# Patient Record
Sex: Male | Born: 1950 | Race: White | Hispanic: Yes | Marital: Married | Smoking: Former smoker
Health system: Southern US, Community
[De-identification: ages and names within clinical notes are randomized; demographics above are authoritative.]

## PROBLEM LIST (undated history)

## (undated) DIAGNOSIS — I10 Essential (primary) hypertension: Secondary | ICD-10-CM

---

## 2006-02-03 ENCOUNTER — Emergency Department (HOSPITAL_COMMUNITY): Admission: EM | Admit: 2006-02-03 | Discharge: 2006-02-03 | Payer: Self-pay | Admitting: Emergency Medicine

## 2007-07-08 ENCOUNTER — Emergency Department: Payer: Self-pay | Admitting: Unknown Physician Specialty

## 2010-09-08 ENCOUNTER — Emergency Department (HOSPITAL_COMMUNITY)
Admission: EM | Admit: 2010-09-08 | Discharge: 2010-09-08 | Disposition: A | Discharge status: Home / Self Care | Payer: Self-pay | Attending: Emergency Medicine | Admitting: Emergency Medicine

## 2010-09-08 DIAGNOSIS — H1130 Conjunctival hemorrhage, unspecified eye: Secondary | ICD-10-CM | POA: Insufficient documentation

## 2011-12-30 ENCOUNTER — Emergency Department: Payer: Self-pay | Admitting: *Deleted

## 2013-01-06 ENCOUNTER — Encounter (HOSPITAL_COMMUNITY): Payer: Self-pay | Admitting: Emergency Medicine

## 2013-01-06 ENCOUNTER — Emergency Department (HOSPITAL_COMMUNITY)
Admission: EM | Admit: 2013-01-06 | Discharge: 2013-01-06 | Disposition: A | Discharge status: Home / Self Care | Payer: Self-pay | Attending: Emergency Medicine | Admitting: Emergency Medicine

## 2013-01-06 DIAGNOSIS — Z87891 Personal history of nicotine dependence: Secondary | ICD-10-CM | POA: Insufficient documentation

## 2013-01-06 DIAGNOSIS — L988 Other specified disorders of the skin and subcutaneous tissue: Secondary | ICD-10-CM | POA: Insufficient documentation

## 2013-01-06 DIAGNOSIS — Z79899 Other long term (current) drug therapy: Secondary | ICD-10-CM | POA: Insufficient documentation

## 2013-01-06 DIAGNOSIS — I10 Essential (primary) hypertension: Secondary | ICD-10-CM | POA: Insufficient documentation

## 2013-01-06 DIAGNOSIS — B86 Scabies: Secondary | ICD-10-CM

## 2013-01-06 DIAGNOSIS — L299 Pruritus, unspecified: Secondary | ICD-10-CM | POA: Insufficient documentation

## 2013-01-06 DIAGNOSIS — R238 Other skin changes: Secondary | ICD-10-CM

## 2013-01-06 HISTORY — DX: Essential (primary) hypertension: I10

## 2013-01-06 MED ORDER — DEXAMETHASONE SODIUM PHOSPHATE 4 MG/ML IJ SOLN
10.0000 mg | Freq: Once | INTRAMUSCULAR | Status: AC
  Administered 2013-01-06: 10 mg via INTRAMUSCULAR
  Filled 2013-01-06: qty 1

## 2013-01-06 MED ORDER — DIPHENHYDRAMINE HCL 25 MG PO TABS: 25.0000 mg | ORAL_TABLET | Freq: Four times a day (QID) | ORAL | Status: DC

## 2013-01-06 MED ORDER — PERMETHRIN 5 % EX CREA: TOPICAL_CREAM | CUTANEOUS | Status: AC

## 2013-01-06 NOTE — ED Provider Notes (Signed)
Medical screening examination/treatment/procedure(s) were performed by non-physician practitioner and as supervising physician I was immediately available for consultation/collaboration.  Alexzia Kasler R. Zakeria Kulzer, MD 01/06/13 2323 

## 2013-01-06 NOTE — ED Notes (Signed)
Rash to rt upper arm , rt side of abd, with swelling of both hands.

## 2013-01-06 NOTE — ED Provider Notes (Signed)
History     CSN: [id number]  Arrival date & time 01/06/13  1953   First MD Initiated Contact with Patient 01/06/13 2055      Chief Complaint  Patient presents with  . Rash    poison ivy    (Consider location/radiation/quality/duration/timing/severity/associated sxs/prior treatment) HPI Gary Franklin is a 62 y.o. male who presents to the ED with a rash. The rash started several days ago. The rash is located on the hands between the fingers and the right forearm. The itching is getting worse. The patient has tried soaking his hands in Clorox and vinegar without relief. Since he started soaking them the hands have gotten red and have some swelling. The history was provided by the patient.    Past Medical History  Diagnosis Date  . Hypertension     History reviewed. No pertinent past surgical history.  No family history on file.  History  Substance Use Topics  . Smoking status: Former Smoker    Quit date: 01/06/1978  . Smokeless tobacco: Not on file  . Alcohol Use: No      Review of Systems  Constitutional: Negative for fever and chills.  HENT: Negative for facial swelling and neck pain.   Respiratory: Negative for shortness of breath.   Gastrointestinal: Negative for nausea and vomiting.  Skin: Positive for rash.  Neurological: Negative for weakness.  Psychiatric/Behavioral: The patient is not nervous/anxious.     Allergies  Review of patient's allergies indicates no known allergies.  Home Medications   Current Outpatient Rx  Name  Route  Sig  Dispense  Refill  . losartan-hydrochlorothiazide (HYZAAR) 50-12.5 MG per tablet   Oral   Take 1 tablet by mouth daily.           BP 117/80  Pulse 86  Temp(Src) 98.9 F (37.2 C) (Oral)  Resp 20  Ht 5' 5.5" (1.664 m)  Wt 174 lb 8 oz (79.153 kg)  BMI 28.59 kg/m2  SpO2 97%  Physical Exam  Nursing note and vitals reviewed. Constitutional: He is oriented to person, place, and time. He appears well-developed and  well-nourished. No distress.  HENT:  Head: Normocephalic.  Eyes: EOM are normal.  Neck: Neck supple.  Cardiovascular: Normal rate.   Pulmonary/Chest: Effort normal.  Musculoskeletal:  Rash noted between fingers and right forearm. linear with track like marks similar to scabies. Hand with some redness and swelling most likely due to the Clorox soaks.  Radial pulses strong, adequate circulation, good touch sensation.    Neurological: He is alert and oriented to person, place, and time. No cranial nerve deficit.  Skin: Skin is warm and dry.  Psychiatric: He has a normal mood and affect. His behavior is normal.    ED Course  Procedures (including critical care time)  MDM  62 y.o. male with rash to hand and right forearm. Will treat for scabies. Discussed with patient irritation of hands most likely due to the Clorox soaks and he should stop. Will give Decadron 10 mg IM now and Benadryl and Elimite cream to go home with. Discussed with the patient if symptoms worsen he will need to follow up with dermatology or return here if swelling increases.  Discussed with the patient and all questioned fully answered. He will return if any problems arise.    Medication List    TAKE these medications       diphenhydrAMINE 25 MG tablet  Commonly known as:  BENADRYL  Take 1 tablet (25 mg total)  by mouth every 6 (six) hours.     permethrin 5 % cream  Commonly known as:  ELIMITE  Apply to affected area once      ASK your doctor about these medications       losartan-hydrochlorothiazide 50-12.5 MG per tablet  Commonly known as:  HYZAAR  Take 1 tablet by mouth daily.               [place], [place] 01/06/13 2114

## 2013-01-06 NOTE — ED Notes (Signed)
Picking flowers two days ago, may have been exposed to poison oak.  Using "Rash Relief" with minimal relief.  Also ate about three red berries - unknown type.  Sour so did not eat more.   Both hands now swollen, rash, itching

## 2013-01-08 ENCOUNTER — Encounter (HOSPITAL_COMMUNITY): Payer: Self-pay | Admitting: *Deleted

## 2013-01-08 ENCOUNTER — Emergency Department (HOSPITAL_COMMUNITY): Admission: EM | Admit: 2013-01-08 | Discharge: 2013-01-08 | Discharge status: Against Medical Advice | Payer: Self-pay

## 2013-01-08 ENCOUNTER — Emergency Department (HOSPITAL_COMMUNITY)
Admission: EM | Admit: 2013-01-08 | Discharge: 2013-01-08 | Disposition: A | Discharge status: Home / Self Care | Payer: Self-pay | Attending: Emergency Medicine | Admitting: Emergency Medicine

## 2013-01-08 DIAGNOSIS — L259 Unspecified contact dermatitis, unspecified cause: Secondary | ICD-10-CM | POA: Insufficient documentation

## 2013-01-08 DIAGNOSIS — Z79899 Other long term (current) drug therapy: Secondary | ICD-10-CM | POA: Insufficient documentation

## 2013-01-08 DIAGNOSIS — Z87891 Personal history of nicotine dependence: Secondary | ICD-10-CM | POA: Insufficient documentation

## 2013-01-08 DIAGNOSIS — I10 Essential (primary) hypertension: Secondary | ICD-10-CM | POA: Insufficient documentation

## 2013-01-08 DIAGNOSIS — R609 Edema, unspecified: Secondary | ICD-10-CM | POA: Insufficient documentation

## 2013-01-08 MED ORDER — HYDROXYZINE HCL 25 MG PO TABS
25.0000 mg | ORAL_TABLET | Freq: Once | ORAL | Status: AC
  Administered 2013-01-08: 25 mg via ORAL
  Filled 2013-01-08: qty 1

## 2013-01-08 MED ORDER — PREDNISONE 50 MG PO TABS
60.0000 mg | ORAL_TABLET | Freq: Once | ORAL | Status: AC
  Administered 2013-01-08: 60 mg via ORAL
  Filled 2013-01-08: qty 1

## 2013-01-08 MED ORDER — HYDROXYZINE HCL 25 MG PO TABS: 25.0000 mg | ORAL_TABLET | Freq: Four times a day (QID) | ORAL | Status: AC

## 2013-01-08 MED ORDER — PREDNISONE 20 MG PO TABS: ORAL_TABLET | ORAL | Status: DC

## 2013-01-08 NOTE — ED Notes (Signed)
Instructions, prescriptions and f/u information given/reviewed - verbalizes understanding.  

## 2013-01-08 NOTE — ED Notes (Signed)
Pt c/o bilateral hand swelling, Was seen here on Tuesday and was given a shot and felt better. Now hands are swollen ans itching again and he has some places on his leg and foot.

## 2013-01-08 NOTE — ED Notes (Addendum)
Pt c/o bilateral hand swelling and itching. Was seen here Tuesday and was given a "shot" that made him feel better. Now his hands are swelling again.

## 2013-01-09 ENCOUNTER — Encounter (HOSPITAL_COMMUNITY): Payer: Self-pay | Admitting: *Deleted

## 2013-01-09 NOTE — ED Provider Notes (Signed)
History     CSN: [id number]  Arrival date & time 01/08/13  1846   First MD Initiated Contact with Patient 01/08/13 1901      Chief Complaint  Patient presents with  . Edema    (Consider location/radiation/quality/duration/timing/severity/associated sxs/prior treatment) HPI Comments: Gary Franklin is a 62 y.o. male who presents to the Emergency Department complaining of continued rash and itching.  States that he was here two days ago for rash to his hands and now the rash has spread to his elbows, right foot and right knee.  He also c/o swelling to his hands and itching.  He was given permethrin cream that he has used w/o relief.  He also states that his symptoms were better initially after receiving a "shot" here in the ED.  He denies fever, vomiting, tick bite, joint pains, difficulty swallowing or breathing, or swelling  The history is provided by the patient.    Past Medical History  Diagnosis Date  . Hypertension     History reviewed. No pertinent past surgical history.  History reviewed. No pertinent family history.  History  Substance Use Topics  . Smoking status: Former Smoker    Quit date: 01/06/1978  . Smokeless tobacco: Not on file  . Alcohol Use: No      Review of Systems  Constitutional: Negative for fever, chills, activity change and appetite change.  HENT: Negative for sore throat, facial swelling, trouble swallowing, neck pain and neck stiffness.   Respiratory: Negative for chest tightness, shortness of breath and wheezing.   Gastrointestinal: Negative for nausea and vomiting.  Musculoskeletal: Negative for arthralgias.  Skin: Positive for rash. Negative for wound.  Neurological: Negative for dizziness, weakness, numbness and headaches.  All other systems reviewed and are negative.    Allergies  Review of patient's allergies indicates no known allergies.  Home Medications   Current Outpatient Rx  Name  Route  Sig  Dispense  Refill  .  Dimethicone-Zinc Oxide (RASH RELIEF) 20-25 % LIQD   Apply externally   Apply 1 spray topically daily as needed (for rash).         Marland Kitchen losartan-hydrochlorothiazide (HYZAAR) 50-12.5 MG per tablet   Oral   Take 1 tablet by mouth daily.         . hydrOXYzine (ATARAX/VISTARIL) 25 MG tablet   Oral   Take 1 tablet (25 mg total) by mouth every 6 (six) hours. Prn itching   15 tablet   0   . permethrin (ELIMITE) 5 % cream      Apply to affected area once   60 g   0   . predniSONE (DELTASONE) 20 MG tablet      Take 3 tablets po qd x 2 days, then 2 tablets po qd x 2 days, then 1 tablet po qd x 2 days   12 tablet   0     BP 144/99  Pulse 72  Temp(Src) 98.6 F (37 C) (Oral)  Resp 18  Ht 5\' 5"  (1.651 m)  Wt 174 lb (78.926 kg)  BMI 28.96 kg/m2  SpO2 100%  Physical Exam  Nursing note and vitals reviewed. Constitutional: He is oriented to person, place, and time. He appears well-developed and well-nourished. No distress.  HENT:  Head: Normocephalic and atraumatic. No trismus in the jaw.  Mouth/Throat: Uvula is midline, oropharynx is clear and moist and mucous membranes are normal. No edematous.  Eyes: Conjunctivae and EOM are normal. Pupils are equal, round, and reactive to  light.  Neck: Normal range of motion. Neck supple.  Cardiovascular: Normal rate, regular rhythm, normal heart sounds and intact distal pulses.   No murmur heard. Pulmonary/Chest: Effort normal and breath sounds normal. No respiratory distress.  Musculoskeletal: He exhibits no edema and no tenderness.  Lymphadenopathy:    He has no cervical adenopathy.  Neurological: He is alert and oriented to person, place, and time. He exhibits normal muscle tone. Coordination normal.  Skin: Rash noted. There is erythema.  Maculopapular rash to the right knee, right dorsal foot, and bilateral elbows.  No edema or rash of the hands seen on exam.  Radial pulses brisk and equal.  No lesions of the hands or web spaces.     Psychiatric: His behavior is normal.    ED Course  Procedures (including critical care time)  Labs Reviewed - No data to display No results found.   1. Contact dermatitis       MDM    Previous ED chart reviewed, patient is well appearing, VSS,  erythematous maculopapular rash to bilateral hands, and now right knee, dorsal right foot and bilateral elbows.  No edema.  Appears at this time c/w contact dermatitis likely secondary to plant exposure.  Limited improvement initially after receiving steroid injection.  I will prescribe prednisone taper and atarax for itching and have him d/c the permethrin cream.  Appears stable for d/c and agrees to return here if needed        Traeson Dusza L. Trisha Mangle, PA-C 01/09/13 1027

## 2013-01-09 NOTE — ED Provider Notes (Signed)
Medical screening examination/treatment/procedure(s) were performed by non-physician practitioner and as supervising physician I was immediately available for consultation/collaboration.    Jandi Swiger D Amato Sevillano, MD 01/09/13 0956 

## 2016-11-20 ENCOUNTER — Encounter: Payer: Self-pay | Admitting: *Deleted

## 2016-11-20 ENCOUNTER — Emergency Department: Payer: Self-pay

## 2016-11-20 ENCOUNTER — Emergency Department
Admission: EM | Admit: 2016-11-20 | Discharge: 2016-11-20 | Disposition: A | Discharge status: Home / Self Care | Payer: Self-pay | Attending: Emergency Medicine | Admitting: Emergency Medicine

## 2016-11-20 DIAGNOSIS — Z79899 Other long term (current) drug therapy: Secondary | ICD-10-CM | POA: Insufficient documentation

## 2016-11-20 DIAGNOSIS — Z87891 Personal history of nicotine dependence: Secondary | ICD-10-CM | POA: Insufficient documentation

## 2016-11-20 DIAGNOSIS — I10 Essential (primary) hypertension: Secondary | ICD-10-CM | POA: Insufficient documentation

## 2016-11-20 DIAGNOSIS — N200 Calculus of kidney: Secondary | ICD-10-CM | POA: Insufficient documentation

## 2016-11-20 LAB — CBC
HCT: 49.6 % (ref 40.0–52.0)
HEMOGLOBIN: 16.7 g/dL (ref 13.0–18.0)
MCH: 28.9 pg (ref 26.0–34.0)
MCHC: 33.7 g/dL (ref 32.0–36.0)
MCV: 85.8 fL (ref 80.0–100.0)
Platelets: 212 10*3/uL (ref 150–440)
RBC: 5.78 MIL/uL (ref 4.40–5.90)
RDW: 13.8 % (ref 11.5–14.5)
WBC: 7.6 10*3/uL (ref 3.8–10.6)

## 2016-11-20 LAB — BASIC METABOLIC PANEL
Anion gap: 10 (ref 5–15)
BUN: 11 mg/dL (ref 6–20)
CHLORIDE: 104 mmol/L (ref 101–111)
CO2: 24 mmol/L (ref 22–32)
Calcium: 9.4 mg/dL (ref 8.9–10.3)
Creatinine, Ser: 1.03 mg/dL (ref 0.61–1.24)
GFR calc Af Amer: >60 mL/min (ref >60)
GFR calc non Af Amer: >60 mL/min (ref >60)
Glucose, Bld: 188 mg/dL — ABNORMAL HIGH (ref 65–99)
POTASSIUM: 3.7 mmol/L (ref 3.5–5.1)
SODIUM: 138 mmol/L (ref 135–145)

## 2016-11-20 LAB — URINALYSIS, COMPLETE (UACMP) WITH MICROSCOPIC
Bacteria, UA: NONE SEEN
Bilirubin Urine: NEGATIVE
Glucose, UA: 150 mg/dL — AB
Hgb urine dipstick: NEGATIVE
KETONES UR: NEGATIVE mg/dL
Leukocytes, UA: NEGATIVE
Nitrite: NEGATIVE
Protein, ur: NEGATIVE mg/dL
SPECIFIC GRAVITY, URINE: 1.012 (ref 1.005–1.030)
SQUAMOUS EPITHELIAL / LPF: NONE SEEN
pH: 6 (ref 5.0–8.0)

## 2016-11-20 MED ORDER — ONDANSETRON HCL 4 MG/2ML IJ SOLN
4.0000 mg | Freq: Once | INTRAMUSCULAR | Status: AC
  Administered 2016-11-20: 4 mg via INTRAVENOUS
  Filled 2016-11-20: qty 2

## 2016-11-20 MED ORDER — TAMSULOSIN HCL 0.4 MG PO CAPS: 0.4000 mg | ORAL_CAPSULE | Freq: Every day | ORAL | 0 refills | Status: AC

## 2016-11-20 MED ORDER — OXYCODONE-ACETAMINOPHEN 5-325 MG PO TABS: 1.0000 | ORAL_TABLET | Freq: Four times a day (QID) | ORAL | 0 refills | Status: AC | PRN

## 2016-11-20 MED ORDER — KETOROLAC TROMETHAMINE 30 MG/ML IJ SOLN
30.0000 mg | Freq: Once | INTRAMUSCULAR | Status: AC
  Administered 2016-11-20: 30 mg via INTRAVENOUS

## 2016-11-20 MED ORDER — KETOROLAC TROMETHAMINE 30 MG/ML IJ SOLN
INTRAMUSCULAR | Status: AC
  Filled 2016-11-20: qty 1

## 2016-11-20 MED ORDER — OXYCODONE-ACETAMINOPHEN 5-325 MG PO TABS
1.0000 | ORAL_TABLET | ORAL | Status: DC | PRN
  Administered 2016-11-20: 1 via ORAL
  Filled 2016-11-20: qty 1

## 2016-11-20 MED ORDER — IBUPROFEN 600 MG PO TABS: 600.0000 mg | ORAL_TABLET | Freq: Four times a day (QID) | ORAL | 0 refills | Status: AC | PRN

## 2016-11-20 MED ORDER — SODIUM CHLORIDE 0.9 % IV BOLUS (SEPSIS)
1000.0000 mL | Freq: Once | INTRAVENOUS | Status: AC
  Administered 2016-11-20: 1000 mL via INTRAVENOUS

## 2016-11-20 NOTE — ED Notes (Signed)
Pt returned from CT via stretcher.

## 2016-11-20 NOTE — ED Notes (Signed)
Pt states he speaks enough english and denied needing an interpreter.   States 2 weeks of R sided back pain that feels like a kidney stone. States hx of kidney stones. Nausea and 3 episodes vomiting today, denies blood in vomit. States has noticed blood in urine. Alert, oriented, able to move self from wheelchair to bed.

## 2016-11-20 NOTE — ED Triage Notes (Signed)
Pt states right groin pain that goes to his back, states pain began 2 weeks ago and progressively getting worse, states blood in his urine, awake alert, interpretor used

## 2016-11-20 NOTE — ED Provider Notes (Signed)
[facility] Emergency Department Provider Note  Time seen: 2:09 PM  I have reviewed the triage vital signs and the nursing notes.   HISTORY  Chief Complaint Back Pain    HPI Gary Franklin is a 66 y.o. male with a past medical history of hypertension, prior kidney stones, presents to the emergency department with right flank pain for the past 2 weeks. According to the patient for the past 2 weeks he has had progressively worsening right flank pain. States it is a dull aching type pain with occasional sharp pains radiating to his groin from his right back. Patient states a history of kidney stones in the past which have passed on their own, he states this does feel similar. He has noted intermittent dark urine, denies any today. States nausea with one episode of vomiting. Denies diarrhea. Denies fever. Denies dysuria.  Past Medical History:  Diagnosis Date  . Hypertension     There are no active problems to display for this patient.   History reviewed. No pertinent surgical history.  Prior to Admission medications   Medication Sig Start Date End Date Taking? Authorizing Provider  Dimethicone-Zinc Oxide (RASH RELIEF) 20-25 % LIQD Apply 1 spray topically daily as needed (for rash).    Historical Provider, MD  hydrOXYzine (ATARAX/VISTARIL) 25 MG tablet Take 1 tablet (25 mg total) by mouth every 6 (six) hours. Prn itching 01/08/13   Tammy Triplett, PA-C  losartan-hydrochlorothiazide (HYZAAR) 50-12.5 MG per tablet Take 1 tablet by mouth daily.    Historical Provider, MD  permethrin (ELIMITE) 5 % cream Apply to affected area once 01/06/13   [facility] Orlene Och, NP  predniSONE (DELTASONE) 20 MG tablet Take 3 tablets po qd x 2 days, then 2 tablets po qd x 2 days, then 1 tablet po qd x 2 days 01/08/13   Tammy Triplett, PA-C    No Known Allergies  History reviewed. No pertinent family history.  Social History Social History  Substance Use Topics  . Smoking  status: Former Smoker    Quit date: 01/06/1978  . Smokeless tobacco: Not on file  . Alcohol use No    Review of Systems Constitutional: Negative for fever. Cardiovascular: Negative for chest pain. Respiratory: Negative for shortness of breath. Gastrointestinal: Right-sided abdominal pain. Genitourinary: Negative for dysuria. Intermittent dark urine. Musculoskeletal: Right back pain Skin: Negative for rash. Neurological: Negative for headache All other ROS negative  ____________________________________________   PHYSICAL EXAM:  VITAL SIGNS: ED Triage Vitals  Enc Vitals Group     BP 11/20/16 1314 140/81     Pulse Rate 11/20/16 1314 65     Resp 11/20/16 1314 18     Temp 11/20/16 1314 98.2 F (36.8 C)     Temp Source 11/20/16 1314 Oral     SpO2 11/20/16 1314 99 %     Weight 11/20/16 1316 184 lb (83.5 kg)     Height 11/20/16 1316  (1.651 m)     Head Circumference --      Peak Flow --      Pain Score 11/20/16 1408 8     Pain Loc --      Pain Edu? --      Excl. in GC? --     Constitutional: Alert and oriented. Well appearing and in no distress. Eyes: Normal exam ENT   Head: Normocephalic and atraumatic   Mouth/Throat: Mucous membranes are moist. Cardiovascular: Normal rate, regular rhythm. No murmur Respiratory: Normal respiratory effort without tachypnea  nor retractions. Breath sounds are clear  Gastrointestinal: Soft, slight right lower quadrant tenderness palpation, slight right CVA tenderness. No rebound or guarding. No distention. Musculoskeletal: Nontender with normal range of motion in all extremities.  Neurologic:  Normal speech and language. No gross focal neurologic deficits  Skin:  Skin is warm, dry and intact.  Psychiatric: Mood and affect are normal.   ____________________________________________   RADIOLOGY  Punctate right UVJ stone with stranding.  ____________________________________________   INITIAL IMPRESSION / ASSESSMENT AND PLAN  / ED COURSE  Pertinent labs & imaging results that were available during my care of the patient were reviewed by me and considered in my medical decision making (see chart for details).  The patient presents to the emergency department right flank pain occasionally radiating to the groin. Patient states occasional dark urine. History of kidney stones in the past with similar symptoms per patient. Patient's labs so far are largely within normal limits including urinalysis. BMP is pending. We will treat the patient's discomfort with Toradol, dose Zofran and IV fluids while awaiting a CT renal scan to confirm ureterolithiasis.  Punctate right UVJ stone with proximal stranding. We will send a urine culture. We will discharge patient on Flomax and Percocet. I discussed taking ibuprofen in addition to this medication. Patient agreeable and will follow-up with urology. I discussed my normal kidney Stone return precautions  ____________________________________________   FINAL CLINICAL IMPRESSION(S) / ED DIAGNOSES  Right flank pain Ureterolithiasis   Minna Antis, MD 11/20/16 1438

## 2016-11-20 NOTE — ED Notes (Signed)
Dr. Paduchowski at bedside.  

## 2016-11-20 NOTE — ED Notes (Signed)
Patient transported to CT 

## 2016-11-21 LAB — URINE CULTURE: Culture: NO GROWTH

## 2018-09-25 IMAGING — CT CT RENAL STONE PROTOCOL
2 of 4 series · 16 of 46 positions shown, 18 images · non-contrast
Comparison: None.

CLINICAL DATA: Nausea and vomiting x3 today. Hematuria. Right side
back pain for 2 weeks. History of urinary tract stones.

EXAM:
CT ABDOMEN AND PELVIS WITHOUT CONTRAST
TECHNIQUE: Multidetector CT imaging of the abdomen and pelvis was performed
following the standard protocol without IV contrast.

[Series 2: stone full standard · axial · 0.77mm/px · z∈[-334,+76]mm · 13 of 92 slices shown, 15 images]
[im 5/92  soft-tissue]
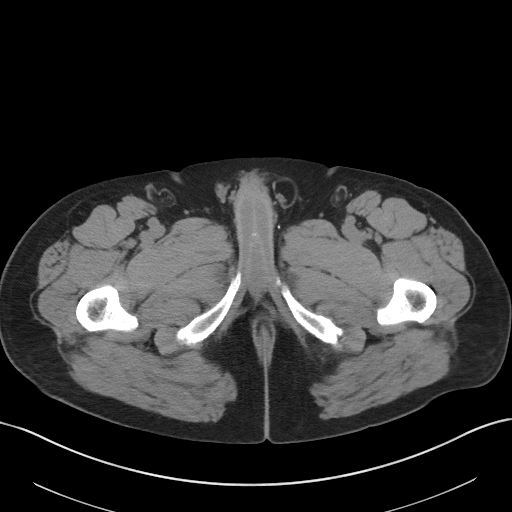
[im 5/92  bone]
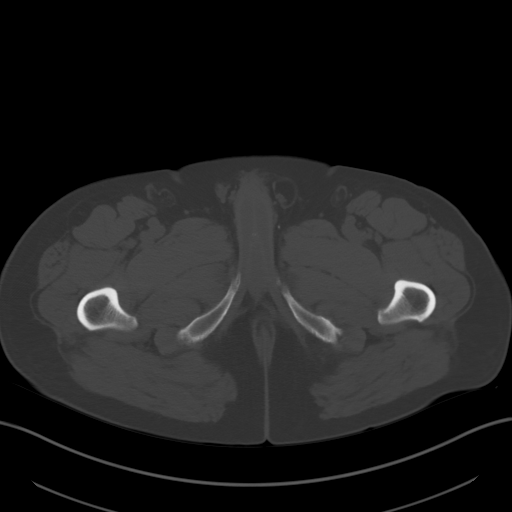
[im 14/92  soft-tissue]
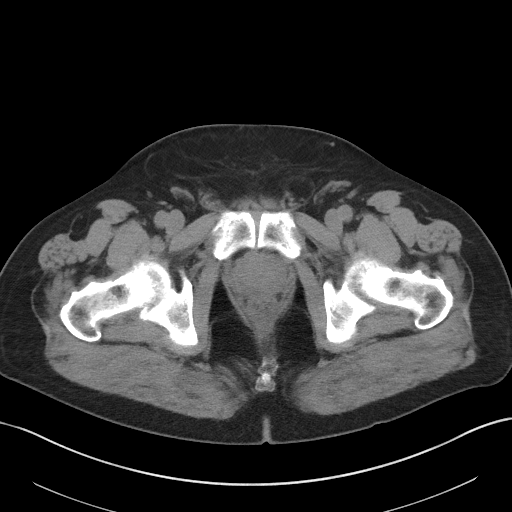
[im 18/92  soft-tissue]
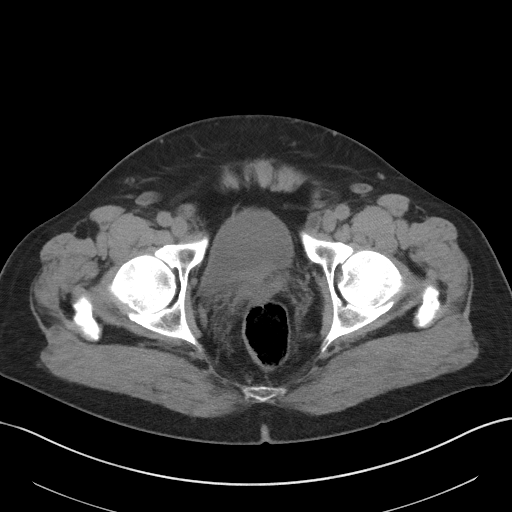
[im 27/92  soft-tissue]
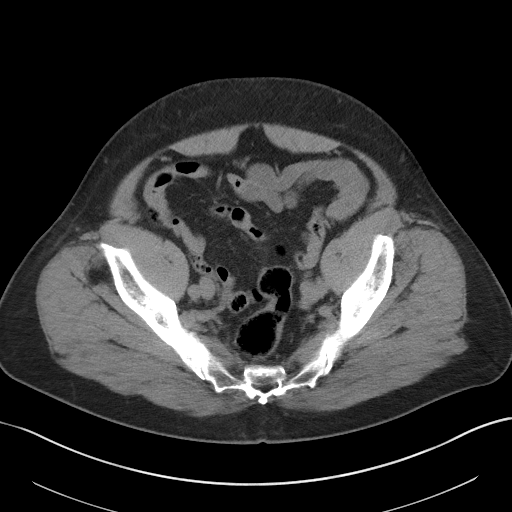
[im 31/92  soft-tissue]
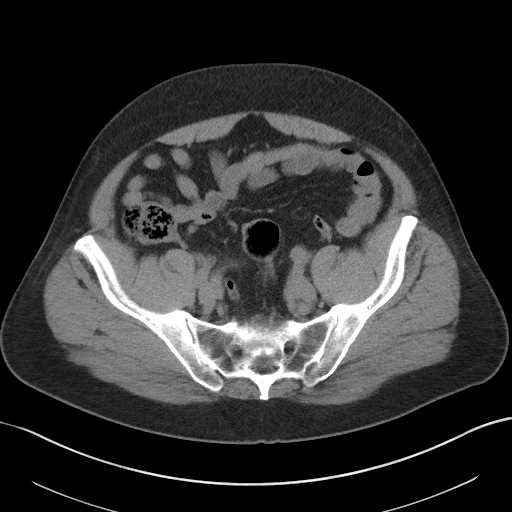
[im 40/92  soft-tissue]
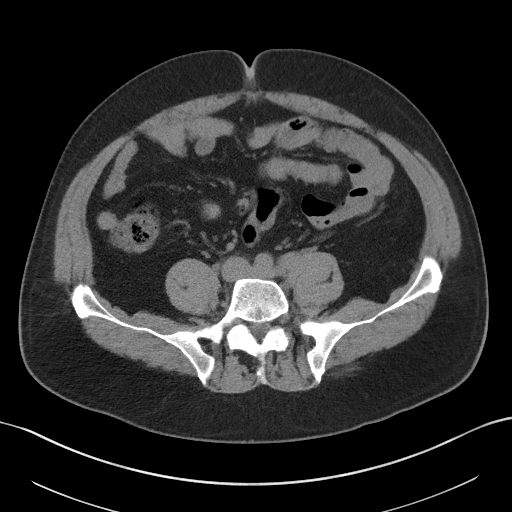
[im 48/92  soft-tissue]
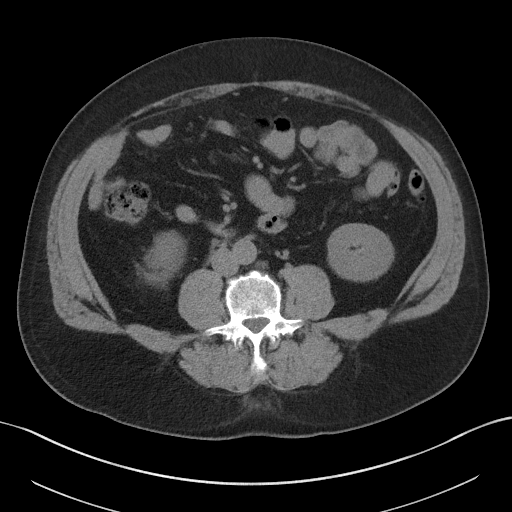
[im 53/92  soft-tissue]
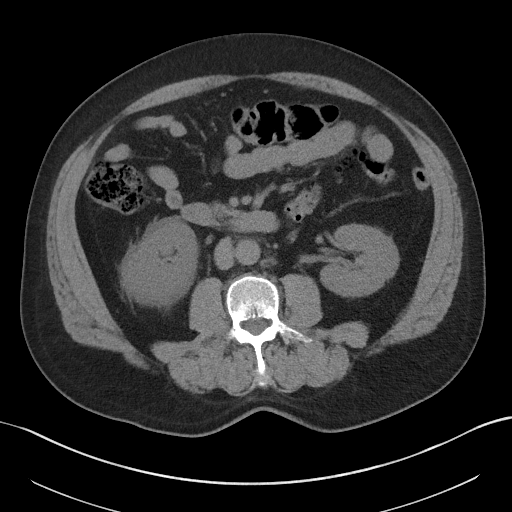
[im 61/92  soft-tissue]
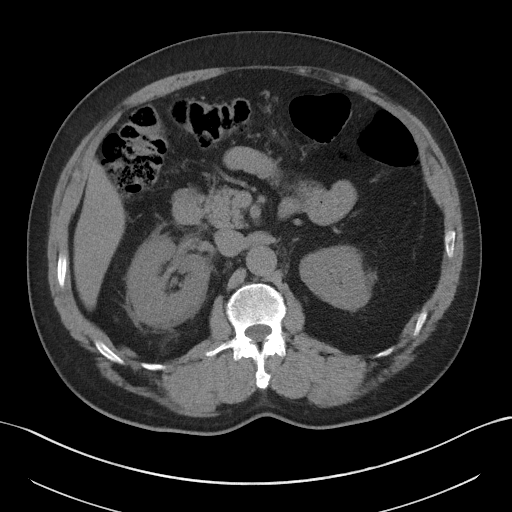
[im 61/92  bone]
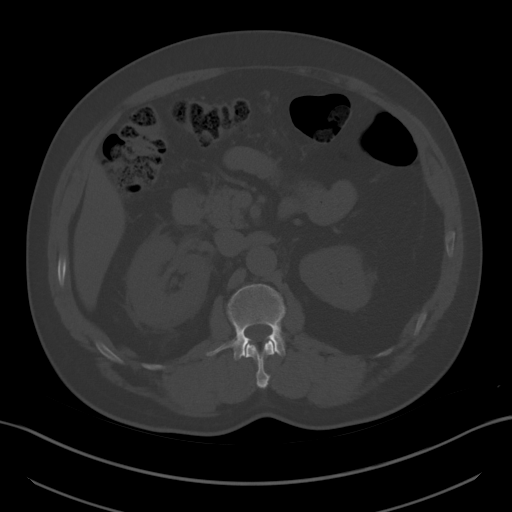
[im 66/92  soft-tissue]
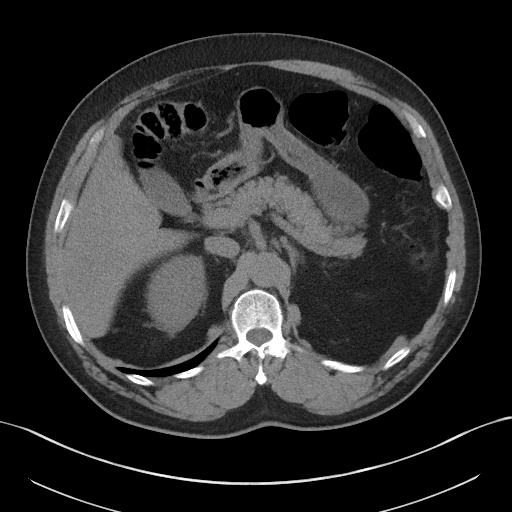
[im 74/92  soft-tissue]
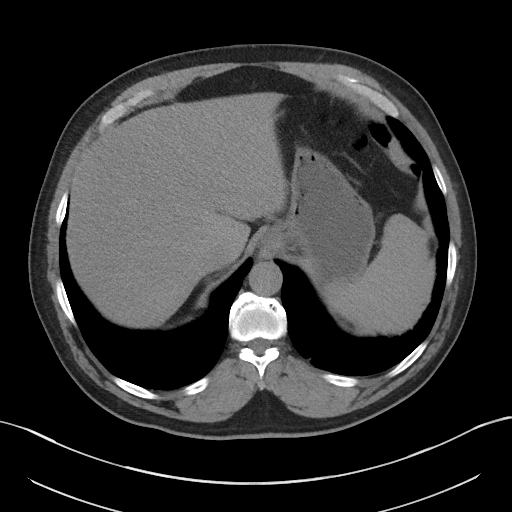
[im 79/92  soft-tissue]
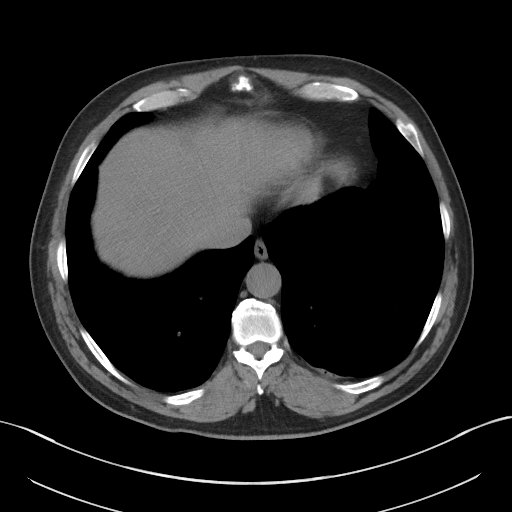
[im 87/92  soft-tissue]
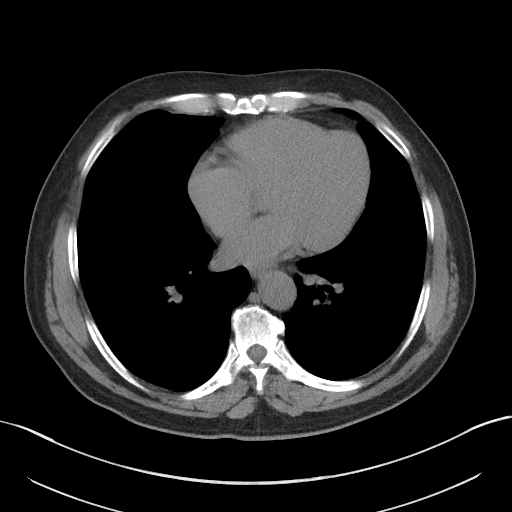

[Series 5: coronal · coronal · 0.77mm/px · 3 of 156 slices shown]
[im 52/156  soft-tissue]
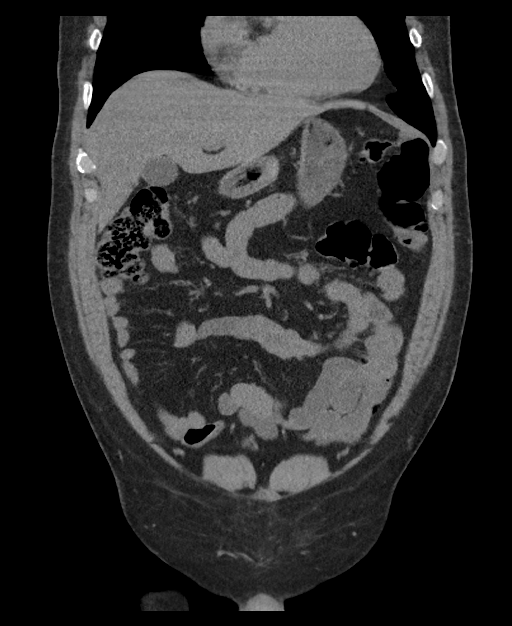
[im 69/156  soft-tissue]
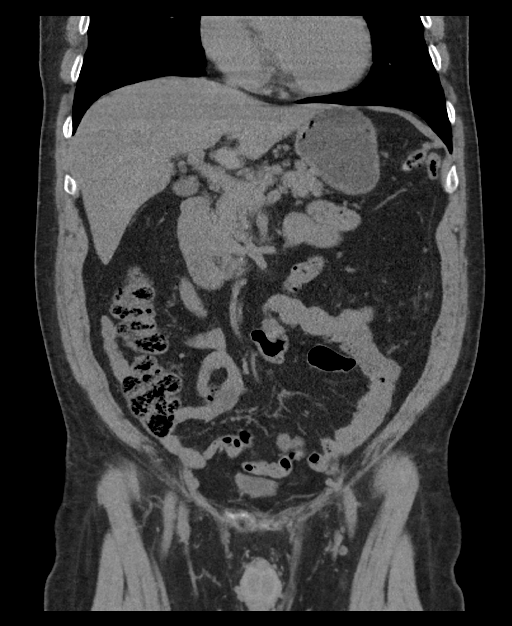
[im 87/156  soft-tissue]
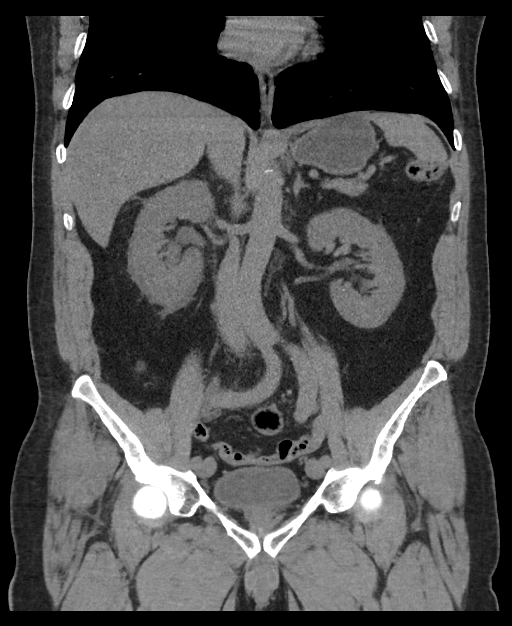

[16 of 46 positions shown; findings below may reference images not displayed]

FINDINGS: Lower chest: Mild dependent atelectasis is seen in the left lung
base. Lung bases otherwise clear. No pleural or pericardial
effusion.

Hepatobiliary: No focal liver abnormality is seen. No gallstones,
gallbladder wall thickening, or biliary dilatation.

Pancreas: Unremarkable. No pancreatic ductal dilatation or
surrounding inflammatory changes.

Spleen: Normal in size without focal abnormality.

Adrenals/Urinary Tract: The adrenal glands appear normal. There is
mild to moderate right hydronephrosis with extensive stranding about
the right kidney and ureter due to a 1-2 mm stone at the right UVJ.
No other urinary tract stones on the right. Punctate nonobstructing
stone lower pole left kidney is noted. Urinary bladder is
unremarkable.

Stomach/Bowel: A few colonic diverticula are seen without
diverticulitis. The stomach, small bowel and appendix appear normal.

Vascular/Lymphatic: No significant vascular findings are present. No
enlarged abdominal or pelvic lymph nodes.

Reproductive: Prostate is unremarkable.

Other: Small fat containing left inguinal hernia is identified. No
ascites.

Musculoskeletal: No lytic or sclerotic lesion. Mild degenerative
disease L5-S1 is seen.
IMPRESSION: Mild to moderate right hydronephrosis with extensive stranding about
the right kidney and ureter due to a punctate stone at the right
UVJ.

Punctate nonobstructing stone lower pole left kidney.

Mild diverticulosis without diverticulitis.

Small fat containing left inguinal hernia.

## 2019-06-15 ENCOUNTER — Other Ambulatory Visit: Payer: Self-pay

## 2019-06-15 DIAGNOSIS — Z20822 Contact with and (suspected) exposure to covid-19: Secondary | ICD-10-CM

## 2019-06-16 LAB — NOVEL CORONAVIRUS, NAA: SARS-CoV-2, NAA: DETECTED — AB

## 2019-09-04 ENCOUNTER — Other Ambulatory Visit: Payer: Self-pay

## 2019-09-04 ENCOUNTER — Ambulatory Visit: Payer: Self-pay | Attending: Internal Medicine

## 2019-09-04 DIAGNOSIS — Z23 Encounter for immunization: Secondary | ICD-10-CM | POA: Insufficient documentation

## 2019-09-04 NOTE — Progress Notes (Signed)
   Covid-19 Vaccination Clinic  Name:  Gary Franklin    MRN: [id number] DOB: 01-24-1951  09/04/2019  Gary Franklin was observed post Covid-19 immunization for 15 minutes without incidence. He was provided with Vaccine Information Sheet and instruction to access the V-Safe system.   Gary Franklin was instructed to call 911 with any severe reactions post vaccine: Marland Kitchen Difficulty breathing  . Swelling of your face and throat  . A fast heartbeat  . A bad rash all over your body  . Dizziness and weakness    Immunizations Administered    Name Date Dose VIS Date Route   Moderna COVID-19 Vaccine 09/04/2019  1:31 PM 0.5 mL 06/23/2019 Intramuscular   Manufacturer: Moderna   Lot: [id number]   NDC: 06269-485-46

## 2019-10-05 ENCOUNTER — Ambulatory Visit: Payer: Self-pay | Attending: Internal Medicine

## 2019-10-05 DIAGNOSIS — Z23 Encounter for immunization: Secondary | ICD-10-CM

## 2019-10-05 NOTE — Progress Notes (Signed)
   Covid-19 Vaccination Clinic  Name:  Severin Bou    MRN: [id number] DOB: 1950/10/25  10/05/2019  Mr. Alvarez-Reyes was observed post Covid-19 immunization for 15 minutes without incident. He was provided with Vaccine Information Sheet and instruction to access the V-Safe system.   Mr. Vantine was instructed to call 911 with any severe reactions post vaccine: Marland Kitchen Difficulty breathing  . Swelling of face and throat  . A fast heartbeat  . A bad rash all over body  . Dizziness and weakness   Immunizations Administered    Name Date Dose VIS Date Route   Moderna COVID-19 Vaccine 10/05/2019  1:05 PM 0.5 mL 06/23/2019 Intramuscular   Manufacturer: Moderna   Lot: [id number]   NDC: 15183-437-35

## 2023-12-17 ENCOUNTER — Ambulatory Visit: Payer: Self-pay

## 2023-12-17 ENCOUNTER — Ambulatory Visit (LOCAL_COMMUNITY_HEALTH_CENTER): Payer: Self-pay

## 2023-12-17 DIAGNOSIS — Z719 Counseling, unspecified: Secondary | ICD-10-CM

## 2023-12-17 DIAGNOSIS — Z23 Encounter for immunization: Secondary | ICD-10-CM

## 2023-12-17 NOTE — Progress Notes (Signed)
 Patient seen in nurse clinic for immunizations for immigration.  Patient presented paper from immigration with Polio and Varicella needed.  Discussed recommendations for Tdap vaccine.  Discussed assistance program for Varicella. Patient preferred to pay for vaccines today.    Varicella and Polio given and paid for by patient.  Tdap given in accordance to NCIP guidelines. Tolerated well. NCIR updated and 2 copies provided.  Discussed return dates for vaccines.    Instruction form for [employer] application given to patient so patient may be able to apply for assistance for next dose of Varicella. Explained if information brought in a few days before appointment - approval could be determined before appointment.

## 2023-12-25 ENCOUNTER — Ambulatory Visit (LOCAL_COMMUNITY_HEALTH_CENTER): Payer: Self-pay

## 2023-12-25 DIAGNOSIS — Z719 Counseling, unspecified: Secondary | ICD-10-CM

## 2023-12-25 DIAGNOSIS — Z23 Encounter for immunization: Secondary | ICD-10-CM

## 2023-12-25 NOTE — Progress Notes (Signed)
 In nurse clinic for immunizations. Patient requesting PCV20 vaccine. Voices no concerns.  VIS reviewed and given to patient. Patient paid for Private PCV20 vaccine. Vaccine tolerated well; no issues noted. NCIR updated and copy given to patient.  Clare Critchley, RN

## 2024-01-10 ENCOUNTER — Other Ambulatory Visit: Payer: Self-pay

## 2024-01-10 ENCOUNTER — Encounter: Payer: Self-pay | Admitting: Emergency Medicine

## 2024-01-10 ENCOUNTER — Emergency Department
Admission: EM | Admit: 2024-01-10 | Discharge: 2024-01-10 | Disposition: A | Discharge status: Home / Self Care | Payer: Self-pay | Attending: Emergency Medicine | Admitting: Emergency Medicine

## 2024-01-10 ENCOUNTER — Emergency Department: Payer: Self-pay

## 2024-01-10 DIAGNOSIS — R7309 Other abnormal glucose: Secondary | ICD-10-CM | POA: Insufficient documentation

## 2024-01-10 DIAGNOSIS — G510 Bell's palsy: Secondary | ICD-10-CM | POA: Insufficient documentation

## 2024-01-10 LAB — DIFFERENTIAL
Abs Immature Granulocytes: 0.02 10*3/uL (ref 0.00–0.07)
Basophils Absolute: 0.1 10*3/uL (ref 0.0–0.1)
Basophils Relative: 1 %
Eosinophils Absolute: 0.2 10*3/uL (ref 0.0–0.5)
Eosinophils Relative: 2 %
Immature Granulocytes: 0 %
Lymphocytes Relative: 22 %
Lymphs Abs: 1.5 10*3/uL (ref 0.7–4.0)
Monocytes Absolute: 0.7 10*3/uL (ref 0.1–1.0)
Monocytes Relative: 11 %
Neutro Abs: 4.5 10*3/uL (ref 1.7–7.7)
Neutrophils Relative %: 64 %

## 2024-01-10 LAB — COMPREHENSIVE METABOLIC PANEL WITH GFR
ALT: 21 U/L (ref 0–44)
AST: 28 U/L (ref 15–41)
Albumin: 3.8 g/dL (ref 3.5–5.0)
Alkaline Phosphatase: 86 U/L (ref 38–126)
Anion gap: 5 (ref 5–15)
BUN: 10 mg/dL (ref 8–23)
CO2: 24 mmol/L (ref 22–32)
Calcium: 8.6 mg/dL — ABNORMAL LOW (ref 8.9–10.3)
Chloride: 108 mmol/L (ref 98–111)
Creatinine, Ser: 0.93 mg/dL (ref 0.61–1.24)
GFR, Estimated: >60 mL/min (ref >60)
Glucose, Bld: 232 mg/dL — ABNORMAL HIGH (ref 70–99)
Potassium: 3.6 mmol/L (ref 3.5–5.1)
Sodium: 137 mmol/L (ref 135–145)
Total Bilirubin: 1.3 mg/dL — ABNORMAL HIGH (ref 0.0–1.2)
Total Protein: 6.9 g/dL (ref 6.5–8.1)

## 2024-01-10 LAB — CBC
HCT: 48.8 % (ref 39.0–52.0)
Hemoglobin: 16.7 g/dL (ref 13.0–17.0)
MCH: 28 pg (ref 26.0–34.0)
MCHC: 34.2 g/dL (ref 30.0–36.0)
MCV: 81.7 fL (ref 80.0–100.0)
Platelets: 214 10*3/uL (ref 150–400)
RBC: 5.97 MIL/uL — ABNORMAL HIGH (ref 4.22–5.81)
RDW: 12.5 % (ref 11.5–15.5)
WBC: 7 10*3/uL (ref 4.0–10.5)
nRBC: 0 % (ref 0.0–0.2)

## 2024-01-10 LAB — PROTIME-INR
INR: 1 (ref 0.8–1.2)
Prothrombin Time: 13.6 s (ref 11.4–15.2)

## 2024-01-10 LAB — ETHANOL: Alcohol, Ethyl (B): <15 mg/dL (ref <15)

## 2024-01-10 LAB — CBG MONITORING, ED: Glucose-Capillary: 230 mg/dL — ABNORMAL HIGH (ref 70–99)

## 2024-01-10 LAB — APTT: aPTT: 36 s (ref 24–36)

## 2024-01-10 MED ORDER — SODIUM CHLORIDE 0.9% FLUSH
3.0000 mL | Freq: Once | INTRAVENOUS | Status: AC
  Administered 2024-01-10: 3 mL via INTRAVENOUS

## 2024-01-10 MED ORDER — PREDNISONE 10 MG PO TABS: ORAL_TABLET | ORAL | 0 refills | Status: AC

## 2024-01-10 NOTE — ED Notes (Signed)
 Pt taken to MRI

## 2024-01-10 NOTE — ED Notes (Signed)
 Pt back from MRI

## 2024-01-10 NOTE — ED Provider Notes (Signed)
 [facility] Provider Note    Event Date/Time   First MD Initiated Contact with Patient 01/10/24 1056     (approximate)   History   Cerebrovascular Accident   HPI  Gary Franklin is a 73 y.o. male with history of high blood pressure who presents with complaints of left-sided facial droop, left arm numbness.  Patient reports he went to bed last night around 1030, was feeling well, woke up this morning at 630, when he went to brush his teeth he noted that toothpaste came out of the left side of his mouth.  He is unable to close his left eye as tightly as his right eye.  He describes a numbness feeling in his left arm but reports the strength appears normal.     Physical Exam   Triage Vital Signs: ED Triage Vitals  Encounter Vitals Group     BP 01/10/24 1051 (!) 167/89     Girls Systolic BP Percentile --      Girls Diastolic BP Percentile --      Boys Systolic BP Percentile --      Boys Diastolic BP Percentile --      Pulse Rate 01/10/24 1051 70     Resp 01/10/24 1051 16     Temp 01/10/24 1051 (!) 97.1 F (36.2 C)     Temp Source 01/10/24 1051 Oral     SpO2 01/10/24 1051 95 %     Weight 01/10/24 1052 79.4 kg (175 lb)     Height 01/10/24 1052 1.651 m (5' 5)     Head Circumference --      Peak Flow --      Pain Score 01/10/24 1052 0     Pain Loc --      Pain Education --      Exclude from Growth Chart --     Most recent vital signs: Vitals:   01/10/24 1509 01/10/24 1510  BP: (!) 143/80   Pulse:  64  Resp:  13  Temp:    SpO2:  92%     General: Awake, no distress.  CV:  Good peripheral perfusion.  Resp:  Normal effort.  Abd:  No distention.  Other:  Mild left facial droop noted, facial nerve fully affected  Normal strength in the upper and lower extremities   ED Results / Procedures / Treatments   Labs (all labs ordered are listed, but only abnormal results are displayed) Labs Reviewed  CBC - Abnormal; Notable for the  following components:      Result Value   RBC 5.97 (*)    All other components within normal limits  COMPREHENSIVE METABOLIC PANEL WITH GFR - Abnormal; Notable for the following components:   Glucose, Bld 232 (*)    Calcium 8.6 (*)    Total Bilirubin 1.3 (*)    All other components within normal limits  CBG MONITORING, ED - Abnormal; Notable for the following components:   Glucose-Capillary 230 (*)    All other components within normal limits  PROTIME-INR  APTT  DIFFERENTIAL  ETHANOL  CBG MONITORING, ED     EKG  ED ECG REPORT I, Bryson Carbine, the attending physician, personally viewed and interpreted this ECG.  Date: 01/10/2024  Rhythm: normal sinus rhythm QRS Axis: normal Intervals: normal ST/T Wave abnormalities: normal Narrative Interpretation: no evidence of acute ischemia    RADIOLOGY CT head without acute abnormality    PROCEDURES:  Critical Care performed:   Procedures  MEDICATIONS ORDERED IN ED: Medications  sodium chloride  flush (NS) 0.9 % injection 3 mL (3 mLs Intravenous Given 01/10/24 1103)     IMPRESSION / MDM / ASSESSMENT AND PLAN / ED COURSE  I reviewed the triage vital signs and the nursing notes. Patient's presentation is most consistent with acute presentation with potential threat to life or bodily function.  Patient presents with neurodeficits as above, differential includes CVA, less likely Bell's palsy.  He is not in the window for TNK  Will send for CT, obtain labs  CT scan is overall reassuring, we will proceed with MRI, lab work is unremarkable  MRIs negative, discussed with Dr. Doretta Gant of neurology who agrees this is consistent with Bell's palsy, will start the patient on prednisone , close outpatient follow-up.  Return precautions discussed      FINAL CLINICAL IMPRESSION(S) / ED DIAGNOSES   Final diagnoses:  Bell's palsy     Rx / DC Orders   ED Discharge Orders          Ordered    predniSONE  (DELTASONE ) 10 MG  tablet  Daily        01/10/24 1544             Note:  This document was prepared using Dragon voice recognition software and may include unintentional dictation errors.   Bryson Carbine, MD 01/10/24 [id number]

## 2024-01-10 NOTE — ED Notes (Signed)
Pt taken to Ct. 

## 2024-01-10 NOTE — ED Triage Notes (Signed)
 Pt to ED via POV. Pt states that he think he has had a stroke. Pt states that he went to bed last night at 2230 and was normal. He woke up at 0630 this morning and noticed that when he washed his face, it felt different on the left side. Pt states that when he was trying to eat food was falling out of the left side of his mouth. Pt reports not being able to move his left eyelid as fast as the right and states that he has numbness in his left hand. No slurred speech is noted at this time. Pt is A & O and was ambulatory to triage without difficulty.

## 2024-07-29 ENCOUNTER — Ambulatory Visit
Admission: EM | Admit: 2024-07-29 | Discharge: 2024-07-29 | Disposition: A | Discharge status: Home / Self Care | Payer: Self-pay | Attending: Nurse Practitioner | Admitting: Nurse Practitioner

## 2024-07-29 DIAGNOSIS — M542 Cervicalgia: Secondary | ICD-10-CM

## 2024-07-29 MED ORDER — TIZANIDINE HCL 4 MG PO TABS: 4.0000 mg | ORAL_TABLET | Freq: Two times a day (BID) | ORAL | 0 refills | Status: AC | PRN

## 2024-07-29 NOTE — Discharge Instructions (Signed)
 Take medication as prescribed.  Please be advised that the medication may cause drowsiness.  No driving, operating heavy equipment, or drinking alcohol while taking the medication. You may take over-the-counter Tylenol  as needed for pain, fever, or general discomfort. Recommend the use of ice or heat.  Apply ice for pain or swelling, heat for spasm or stiffness.  Apply for 20 minutes, remove for 1 hour, repeat as needed. Gentle range of motion exercises to help decrease your recovery time. Go to the emergency department if you experience worsening neck pain, become unable to turn your head or neck, develop numbness or tingling in your arms or hands, or develop difficulty swallowing. Follow-up with your primary care physician if symptoms fail to improve. Follow-up as needed.

## 2024-07-29 NOTE — ED Triage Notes (Signed)
 Pt reports pain in the neck on the right side has tried OTC meds but has not found much relief.

## 2024-07-29 NOTE — ED Provider Notes (Signed)
 " [facility]    CSN: [id number] Arrival date & time: 07/29/24  1235      History   Chief Complaint No chief complaint on file.   HPI Gary Franklin is a 74 y.o. male.   The history is provided by the patient.   Patient presents for complaints of right sided neck pain has been present for the past day.  He states when he woke up yesterday, he had pain in his neck.  He endorses pain with movement to include looking to his left side, and when looking up or down.  He states that the neck feels tight.  He denies injury, trauma, trouble swallowing, neck rigidity, ear pain, throat pain, or numbness or tingling in his upper extremities.  States that he did take ibuprofen  with some relief.  Past Medical History:  Diagnosis Date   Hypertension     There are no active problems to display for this patient.   History reviewed. No pertinent surgical history.     Home Medications    Prior to Admission medications  Medication Sig Start Date End Date Taking? Authorizing Provider  tiZANidine  (ZANAFLEX ) 4 MG tablet Take 1 tablet (4 mg total) by mouth 2 (two) times daily as needed. 07/29/24  Yes Leath-Warren, Etta PARAS, NP  Dimethicone-Zinc Oxide (RASH RELIEF) 20-25 % LIQD Apply 1 spray topically daily as needed (for rash).    [provider]  hydrOXYzine  (ATARAX /VISTARIL ) 25 MG tablet Take 1 tablet (25 mg total) by mouth every 6 (six) hours. Prn itching 01/08/13   Triplett, Tammy, PA-C  ibuprofen  (ADVIL ,MOTRIN ) 600 MG tablet Take 1 tablet (600 mg total) by mouth every 6 (six) hours as needed. 11/20/16   Dorothyann Drivers, MD  losartan-hydrochlorothiazide (HYZAAR) 50-12.5 MG per tablet Take 1 tablet by mouth daily.    [provider]  permethrin  (ELIMITE ) 5 % cream Apply to affected area once 01/06/13   Jamelle Lorrayne HERO, NP  tamsulosin  (FLOMAX ) 0.4 MG CAPS capsule Take 1 capsule (0.4 mg total) by mouth daily. 11/20/16   Dorothyann Drivers, MD    Family  History History reviewed. No pertinent family history.  Social History Social History[1]   Allergies   Patient has no known allergies.   Review of Systems Review of Systems Per HPI  Physical Exam Triage Vital Signs ED Triage Vitals  Encounter Vitals Group     BP 07/29/24 1331 127/83     Girls Systolic BP Percentile --      Girls Diastolic BP Percentile --      Boys Systolic BP Percentile --      Boys Diastolic BP Percentile --      Pulse Rate 07/29/24 1331 80     Resp 07/29/24 1331 18     Temp 07/29/24 1331 98.4 F (36.9 C)     Temp Source 07/29/24 1331 Oral     SpO2 07/29/24 1331 94 %     Weight --      Height --      Head Circumference --      Peak Flow --      Pain Score 07/29/24 1333 9     Pain Loc --      Pain Education --      Exclude from Growth Chart --    No data found.  Updated Vital Signs BP 127/83 (BP Location: Right Arm)   Pulse 80   Temp 98.4 F (36.9 C) (Oral)   Resp 18   SpO2 94%  Visual Acuity Right Eye Distance:   Left Eye Distance:   Bilateral Distance:    Right Eye Near:   Left Eye Near:    Bilateral Near:     Physical Exam Vitals and nursing note reviewed.  Constitutional:      General: He is not in acute distress.    Appearance: Normal appearance.  HENT:     Head: Normocephalic.  Eyes:     Extraocular Movements: Extraocular movements intact.     Pupils: Pupils are equal, round, and reactive to light.  Neck:   Cardiovascular:     Rate and Rhythm: Normal rate and regular rhythm.     Pulses: Normal pulses.     Heart sounds: Normal heart sounds.  Pulmonary:     Effort: Pulmonary effort is normal.     Breath sounds: Normal breath sounds.  Musculoskeletal:     Cervical back: No erythema, signs of trauma or rigidity. Pain with movement and muscular tenderness present. No spinous process tenderness. Decreased range of motion.  Lymphadenopathy:     Cervical: No cervical adenopathy.  Skin:    General: Skin is warm and dry.   Neurological:     General: No focal deficit present.     Mental Status: He is alert and oriented to person, place, and time.  Psychiatric:        Mood and Affect: Mood normal.        Behavior: Behavior normal.      UC Treatments / Results  Labs (all labs ordered are listed, but only abnormal results are displayed) Labs Reviewed - No data to display  EKG   Radiology No results found.  Procedures Procedures (including critical care time)  Medications Ordered in UC Medications - No data to display  Initial Impression / Assessment and Plan / UC Course  I have reviewed the triage vital signs and the nursing notes.  Pertinent labs & imaging results that were available during my care of the patient were reviewed by me and considered in my medical decision making (see chart for details).  On exam, patient with with spasm noted to the right neck.  There is no obvious bruising or deformity present.  Symptoms consistent with neck pain.  Will treat with tizanidine  4 mg.  Supportive care recommendations were provided and discussed with the patient to include continuing over-the-counter analgesics such as Tylenol , use of ice or heat, and stretching exercises.  Discussed indications with the patient regarding follow-up.  Patient was given strict ER follow-up precautions, along with indications to follow-up with his primary care physician.  Patient was in agreement with this plan of care and verbalizes understanding.  All questions were answered.  Patient stable for discharge.   Final Clinical Impressions(s) / UC Diagnoses   Final diagnoses:  Neck pain     Discharge Instructions      Take medication as prescribed.  Please be advised that the medication may cause drowsiness.  No driving, operating heavy equipment, or drinking alcohol while taking the medication. You may take over-the-counter Tylenol  as needed for pain, fever, or general discomfort. Recommend the use of ice or heat.   Apply ice for pain or swelling, heat for spasm or stiffness.  Apply for 20 minutes, remove for 1 hour, repeat as needed. Gentle range of motion exercises to help decrease your recovery time. Go to the emergency department if you experience worsening neck pain, become unable to turn your head or neck, develop numbness or tingling in your  arms or hands, or develop difficulty swallowing. Follow-up with your primary care physician if symptoms fail to improve. Follow-up as needed.     ED Prescriptions     Medication Sig Dispense Auth. Provider   tiZANidine  (ZANAFLEX ) 4 MG tablet Take 1 tablet (4 mg total) by mouth 2 (two) times daily as needed. 20 tablet Leath-Warren, Etta PARAS, NP      PDMP not reviewed this encounter.    [1]  Social History Tobacco Use   Smoking status: Former    Current packs/day: 0.00    Types: Cigarettes    Quit date: 01/06/1978    Years since quitting: 46.5  Substance Use Topics   Alcohol use: No   Drug use: No     Gilmer Etta PARAS, NP 07/29/24 1404  "
